# Patient Record
Sex: Male | Born: 1960 | Race: Black or African American | Hispanic: No | Marital: Married | Smoking: Never smoker
Health system: Southern US, Community
[De-identification: ages and names within clinical notes are randomized; demographics above are authoritative.]

## PROBLEM LIST (undated history)

## (undated) DIAGNOSIS — I10 Essential (primary) hypertension: Secondary | ICD-10-CM

## (undated) DIAGNOSIS — E119 Type 2 diabetes mellitus without complications: Secondary | ICD-10-CM

## (undated) HISTORY — PX: CERVICAL SPINE SURGERY: SHX589

## (undated) HISTORY — PX: CYST EXCISION: SHX5701

## (undated) HISTORY — PX: ACHILLES TENDON REPAIR: SUR1153

---

## 1997-08-08 ENCOUNTER — Encounter: Admission: RE | Admit: 1997-08-08 | Discharge: 1997-11-06 | Payer: Self-pay | Admitting: Orthopedic Surgery

## 1999-01-05 ENCOUNTER — Encounter: Payer: Self-pay | Admitting: Neurosurgery

## 1999-01-09 ENCOUNTER — Encounter: Payer: Self-pay | Admitting: Neurosurgery

## 1999-01-09 ENCOUNTER — Inpatient Hospital Stay (HOSPITAL_COMMUNITY): Admission: RE | Admit: 1999-01-09 | Discharge: 1999-01-10 | Payer: Self-pay | Admitting: Neurosurgery

## 1999-01-25 ENCOUNTER — Encounter: Payer: Self-pay | Admitting: Neurosurgery

## 1999-01-25 ENCOUNTER — Encounter: Admission: RE | Admit: 1999-01-25 | Discharge: 1999-01-25 | Payer: Self-pay | Admitting: Neurosurgery

## 1999-03-05 ENCOUNTER — Encounter: Admission: RE | Admit: 1999-03-05 | Discharge: 1999-03-05 | Payer: Self-pay | Admitting: Neurosurgery

## 1999-03-05 ENCOUNTER — Encounter: Payer: Self-pay | Admitting: Neurosurgery

## 2013-03-04 ENCOUNTER — Other Ambulatory Visit: Payer: Self-pay | Admitting: Family Medicine

## 2013-03-04 ENCOUNTER — Ambulatory Visit
Admission: RE | Admit: 2013-03-04 | Discharge: 2013-03-04 | Disposition: A | Discharge status: Home / Self Care | Payer: 59 | Source: Ambulatory Visit | Attending: Family Medicine | Admitting: Family Medicine

## 2013-03-04 DIAGNOSIS — R0789 Other chest pain: Secondary | ICD-10-CM

## 2013-03-04 DIAGNOSIS — R05 Cough: Secondary | ICD-10-CM

## 2013-03-04 DIAGNOSIS — R059 Cough, unspecified: Secondary | ICD-10-CM

## 2015-07-01 ENCOUNTER — Emergency Department (HOSPITAL_BASED_OUTPATIENT_CLINIC_OR_DEPARTMENT_OTHER)
Admission: EM | Admit: 2015-07-01 | Discharge: 2015-07-01 | Disposition: A | Discharge status: Home / Self Care | Payer: 59 | Attending: Emergency Medicine | Admitting: Emergency Medicine

## 2015-07-01 ENCOUNTER — Encounter (HOSPITAL_BASED_OUTPATIENT_CLINIC_OR_DEPARTMENT_OTHER): Payer: Self-pay | Admitting: Emergency Medicine

## 2015-07-01 ENCOUNTER — Emergency Department (HOSPITAL_BASED_OUTPATIENT_CLINIC_OR_DEPARTMENT_OTHER): Payer: 59

## 2015-07-01 DIAGNOSIS — Y9302 Activity, running: Secondary | ICD-10-CM | POA: Diagnosis not present

## 2015-07-01 DIAGNOSIS — S86002A Unspecified injury of left Achilles tendon, initial encounter: Secondary | ICD-10-CM | POA: Diagnosis not present

## 2015-07-01 DIAGNOSIS — Y999 Unspecified external cause status: Secondary | ICD-10-CM | POA: Insufficient documentation

## 2015-07-01 DIAGNOSIS — X58XXXA Exposure to other specified factors, initial encounter: Secondary | ICD-10-CM | POA: Insufficient documentation

## 2015-07-01 DIAGNOSIS — I10 Essential (primary) hypertension: Secondary | ICD-10-CM | POA: Insufficient documentation

## 2015-07-01 DIAGNOSIS — S99912A Unspecified injury of left ankle, initial encounter: Secondary | ICD-10-CM | POA: Diagnosis present

## 2015-07-01 DIAGNOSIS — Y929 Unspecified place or not applicable: Secondary | ICD-10-CM | POA: Diagnosis not present

## 2015-07-01 HISTORY — DX: Essential (primary) hypertension: I10

## 2015-07-01 NOTE — ED Provider Notes (Signed)
CSN: [id number]     Arrival date & time 07/01/15  0909 History   First MD Initiated Contact with Patient 07/01/15 0915     Chief Complaint  Patient presents with  . Ankle Pain    (Consider location/radiation/quality/duration/timing/severity/associated sxs/prior Treatment) Patient is a 55 y.o. male presenting with ankle pain. The history is provided by the patient and medical records. No language interpreter was used.  Ankle Pain  Christy Sartoriusnthony V Bodin is a 55 y.o. male  with a PMH of HTN who presents to the Emergency Department complaining of non-radiating left posterior ankle pain which began while running at a softball game last night. Patient states he was turning around first base when he felt and heard a pop. He was able to bear weight following the incident, but pain and swelling were appreciated. Ibuprofen with some relief. No pain at rest, but pain with plantarflexion and ambulation. No recent fluoroquinolone or steroid use.  Past Medical History  Diagnosis Date  . Hypertension    History reviewed. No pertinent past surgical history. History reviewed. No pertinent family history. Social History  Substance Use Topics  . Smoking status: Never Smoker   . Smokeless tobacco: None  . Alcohol Use: Yes     Comment: very rarley     Review of Systems  Musculoskeletal: Positive for myalgias and arthralgias.  Skin: Negative for color change.      Allergies  Review of patient's allergies indicates no known allergies.  Home Medications   Prior to Admission medications   Not on File   BP 128/81 mmHg  Pulse 72  Temp(Src) 98.6 F (37 C) (Oral)  Resp 18  Ht 5\' 7"  (1.702 m)  Wt 106.142 kg  BMI 36.64 kg/m2  SpO2 97% Physical Exam  Constitutional: He is oriented to person, place, and time. He appears well-developed and well-nourished.  Alert and in no acute distress  HENT:  Head: Normocephalic and atraumatic.  Cardiovascular: Normal rate, regular rhythm and normal heart  sounds.   Pulmonary/Chest: Effort normal and breath sounds normal. No respiratory distress.  Musculoskeletal:       Legs: No tenderness to palpation of the forefoot, lateral/medial malleolus. TTP as depicted in image, associated swelling. No warmth or ecchymosis present. Patient able to ambulate but unable to stand on toes on affected side. Decreased ROM with plantarflexion.   Neurological: He is alert and oriented to person, place, and time.  Skin: Skin is warm and dry.  Nursing note and vitals reviewed.   ED Course  Procedures (including critical care time) Labs Review Labs Reviewed - No data to display  Imaging Review Dg Ankle Complete Left  07/01/2015  CLINICAL DATA:  Left posterior ankle pain after softball related injury yesterday, pt states he felt his achilles tendon popDifficulty bearing weight EXAM: LEFT ANKLE COMPLETE - 3+ VIEW COMPARISON:  None. FINDINGS: No acute fracture. Ankle mortise is normally spaced and aligned. There is a bony prominence along the dorsal aspect of the talus just anterior to the ankle joint. There is some edema in the pre Achilles fat. Overall, the Achilles shadow suggests that the tendon is at least partly intact. IMPRESSION: 1. No fracture or dislocation. 2. Pre Achilles soft tissue edema. No evidence of complete disruption of the Achilles tendon. Consider follow-up ankle MRI if a more significant Achilles injury is suspected clinically. Electronically Signed   By: Amie Portlandavid  Ormond M.D.   On: 07/01/2015 09:51   I have personally reviewed and evaluated these images and lab  results as part of my medical decision-making.   EKG Interpretation None      MDM   Final diagnoses:  Achilles tendon injury, left, initial encounter   JAQUIS PICKLESIMER presents to ED for left posterior ankle pain. On exam, pain is in the area of the Achilles and findings consistent with Achilles tendon injury. X-rays were obtained which show pre-Achilles soft tissue edema with  no evidence of complete disruption of achilles tendon. Patient placed in posterior splint and instructed to call ortho first thing Monday morning for follow up appointment. Patient re-evaluated following splint placement and feels comfortable. Good cap refill. Home care instructions discussed and all questions answered.   [facility] Ward, PA-C 07/01/15 1039  Pricilla Loveless, MD 07/02/15 [phone]

## 2015-07-01 NOTE — ED Notes (Signed)
PA at bedside.

## 2015-07-01 NOTE — ED Notes (Signed)
Patient transported to X-ray 

## 2015-07-01 NOTE — ED Notes (Signed)
Splint being placed by EMT

## 2015-07-01 NOTE — ED Notes (Signed)
C/o left ankle pain while running in a softball game last night. Able to bear weight but pain with flexion of toes. Mild swelling noted.

## 2015-07-01 NOTE — Discharge Instructions (Signed)
Please call the orthopedic physician listed first thing Monday morning to schedule an appointment. Use crutches until you see the orthopedic physician. Return to ER for any new or worsening symptoms, any additional concerns.  Partial (Incomplete) Achilles Tendon Rupture An Achilles tendon rupture is an injury in which the tough, cord-like band that attaches the lower muscles of your leg to your heel (Achilles tendon) tears (ruptures). In a partial Achilles tendon rupture, surgery may not be needed. CAUSES  A tendon may rupture if it is weakened or weakening (degenerative) and a sudden stress is applied to it. Weakening or degeneration of a tendon may be caused by:  Recurrent injuries, such as those causing Achilles tendonitis.  Damaged tendons.  Aging.  Vascular disease of the tendon. SIGNS AND SYMPTOMS  Feeling as if you were struck violently in the back of the ankle.  Hearing a "pop" and experiencing severe, sudden (acute) pain; however, the absence of pain does not mean there was not a rupture. DIAGNOSIS A physical exam is usually all that is needed to diagnose an Achilles tendon rupture. During the exam, your health care provider will touch the tendon and the structures around it. You may be asked to lie on your stomach or kneel on a chair while your health care provider squeezes your calf muscle. You most likely have a ruptured tendon if your foot does not flex.  Sometimes tests are performed. These may include:  Ultrasonography. This allows quick confirmation of the diagnosis.  An X-ray exam.  An MRI. TREATMENT  Treatment consists of:  Ice applied to the area.  Pain relieving medicines.  Rest.  Crutches.  Keeping the ankle from moving (immobilization), usually with asplint, for 6-10 weeks. If the injury does not improve within 3-6 months, you may need to go to a specialized runners' clinic or see a sports medicine specialist, physical therapist, or orthopedic  surgeon. HOME CARE INSTRUCTIONS   Apply ice to the injured area:   Put ice in a plastic bag.  Place a towel between your skin and the bag.  Leave the ice on for 20 minutes, 2-3 times a day.  Use crutches and move about only as instructed by your health care provider.  Keep the leg elevated above the level of the heart (the center of the chest) at all times when not using the bathroom. Do not dangle the leg over a chair, couch, or bed. When lying down, elevate your leg on a few pillows. Elevation prevents swelling and reduces pain.  Avoid use other than gentle range of motion of the toes while the tendon is painful.  Do not drive a car until your health care provider specifically tells you it is safe to do so.  Take all medicines as directed by your health care provider.  Keep all follow-up visits with your health care provider. SEEK MEDICAL CARE IF:   Your pain and swelling increase or pain is uncontrolled with medicines.   You develop new, unexplained symptoms or your symptoms get worse.   You cannot move your toes or foot.  You develop warmth and swelling in your foot.  You have an unexplained fever.  MAKE SURE YOU:   Understand these instructions.  Will watch your condition.  Will get help right away if you are not doing well or get worse.   This information is not intended to replace advice given to you by your health care provider. Make sure you discuss any questions you have with your health  care provider.   Document Released: 11/07/2004 Document Revised: 06/14/2014 Document Reviewed: 09/18/2012 Elsevier Interactive Patient Education [employer]

## 2015-07-17 DIAGNOSIS — M66362 Spontaneous rupture of flexor tendons, left lower leg: Secondary | ICD-10-CM | POA: Diagnosis not present

## 2015-08-16 DIAGNOSIS — M66362 Spontaneous rupture of flexor tendons, left lower leg: Secondary | ICD-10-CM | POA: Diagnosis not present

## 2015-08-21 DIAGNOSIS — M25672 Stiffness of left ankle, not elsewhere classified: Secondary | ICD-10-CM | POA: Diagnosis not present

## 2015-08-21 DIAGNOSIS — M6281 Muscle weakness (generalized): Secondary | ICD-10-CM | POA: Diagnosis not present

## 2015-08-21 DIAGNOSIS — M25572 Pain in left ankle and joints of left foot: Secondary | ICD-10-CM | POA: Diagnosis not present

## 2015-08-21 DIAGNOSIS — M66362 Spontaneous rupture of flexor tendons, left lower leg: Secondary | ICD-10-CM | POA: Diagnosis not present

## 2015-08-28 DIAGNOSIS — M25672 Stiffness of left ankle, not elsewhere classified: Secondary | ICD-10-CM | POA: Diagnosis not present

## 2015-08-28 DIAGNOSIS — M25572 Pain in left ankle and joints of left foot: Secondary | ICD-10-CM | POA: Diagnosis not present

## 2015-08-28 DIAGNOSIS — M66362 Spontaneous rupture of flexor tendons, left lower leg: Secondary | ICD-10-CM | POA: Diagnosis not present

## 2015-08-28 DIAGNOSIS — M6281 Muscle weakness (generalized): Secondary | ICD-10-CM | POA: Diagnosis not present

## 2015-09-11 DIAGNOSIS — M25672 Stiffness of left ankle, not elsewhere classified: Secondary | ICD-10-CM | POA: Diagnosis not present

## 2015-09-11 DIAGNOSIS — M25572 Pain in left ankle and joints of left foot: Secondary | ICD-10-CM | POA: Diagnosis not present

## 2015-09-11 DIAGNOSIS — M6281 Muscle weakness (generalized): Secondary | ICD-10-CM | POA: Diagnosis not present

## 2015-09-11 DIAGNOSIS — M66362 Spontaneous rupture of flexor tendons, left lower leg: Secondary | ICD-10-CM | POA: Diagnosis not present

## 2015-09-18 DIAGNOSIS — M25572 Pain in left ankle and joints of left foot: Secondary | ICD-10-CM | POA: Diagnosis not present

## 2015-09-18 DIAGNOSIS — M66362 Spontaneous rupture of flexor tendons, left lower leg: Secondary | ICD-10-CM | POA: Diagnosis not present

## 2015-09-18 DIAGNOSIS — M25672 Stiffness of left ankle, not elsewhere classified: Secondary | ICD-10-CM | POA: Diagnosis not present

## 2015-09-18 DIAGNOSIS — M6281 Muscle weakness (generalized): Secondary | ICD-10-CM | POA: Diagnosis not present

## 2015-11-29 DIAGNOSIS — I10 Essential (primary) hypertension: Secondary | ICD-10-CM | POA: Diagnosis not present

## 2015-11-29 DIAGNOSIS — J219 Acute bronchiolitis, unspecified: Secondary | ICD-10-CM | POA: Diagnosis not present

## 2015-11-29 DIAGNOSIS — E785 Hyperlipidemia, unspecified: Secondary | ICD-10-CM | POA: Diagnosis not present

## 2015-11-29 DIAGNOSIS — J069 Acute upper respiratory infection, unspecified: Secondary | ICD-10-CM | POA: Diagnosis not present

## 2016-01-30 DIAGNOSIS — J069 Acute upper respiratory infection, unspecified: Secondary | ICD-10-CM | POA: Diagnosis not present

## 2017-01-08 ENCOUNTER — Emergency Department (HOSPITAL_BASED_OUTPATIENT_CLINIC_OR_DEPARTMENT_OTHER): Payer: BLUE CROSS/BLUE SHIELD

## 2017-01-08 ENCOUNTER — Emergency Department (HOSPITAL_BASED_OUTPATIENT_CLINIC_OR_DEPARTMENT_OTHER)
Admission: EM | Admit: 2017-01-08 | Discharge: 2017-01-08 | Disposition: A | Discharge status: Home / Self Care | Payer: BLUE CROSS/BLUE SHIELD | Attending: Emergency Medicine | Admitting: Emergency Medicine

## 2017-01-08 ENCOUNTER — Encounter (HOSPITAL_BASED_OUTPATIENT_CLINIC_OR_DEPARTMENT_OTHER): Payer: Self-pay

## 2017-01-08 DIAGNOSIS — R634 Abnormal weight loss: Secondary | ICD-10-CM | POA: Diagnosis not present

## 2017-01-08 DIAGNOSIS — R42 Dizziness and giddiness: Secondary | ICD-10-CM | POA: Insufficient documentation

## 2017-01-08 DIAGNOSIS — H538 Other visual disturbances: Secondary | ICD-10-CM | POA: Diagnosis not present

## 2017-01-08 DIAGNOSIS — R631 Polydipsia: Secondary | ICD-10-CM | POA: Insufficient documentation

## 2017-01-08 DIAGNOSIS — R5383 Other fatigue: Secondary | ICD-10-CM | POA: Insufficient documentation

## 2017-01-08 DIAGNOSIS — R358 Other polyuria: Secondary | ICD-10-CM | POA: Insufficient documentation

## 2017-01-08 DIAGNOSIS — R1013 Epigastric pain: Secondary | ICD-10-CM | POA: Diagnosis not present

## 2017-01-08 DIAGNOSIS — R739 Hyperglycemia, unspecified: Secondary | ICD-10-CM | POA: Diagnosis not present

## 2017-01-08 DIAGNOSIS — I10 Essential (primary) hypertension: Secondary | ICD-10-CM | POA: Diagnosis not present

## 2017-01-08 DIAGNOSIS — E0810 Diabetes mellitus due to underlying condition with ketoacidosis without coma: Secondary | ICD-10-CM | POA: Diagnosis not present

## 2017-01-08 DIAGNOSIS — R109 Unspecified abdominal pain: Secondary | ICD-10-CM | POA: Diagnosis not present

## 2017-01-08 DIAGNOSIS — R5381 Other malaise: Secondary | ICD-10-CM | POA: Insufficient documentation

## 2017-01-08 DIAGNOSIS — N179 Acute kidney failure, unspecified: Secondary | ICD-10-CM | POA: Diagnosis not present

## 2017-01-08 LAB — CBC WITH DIFFERENTIAL/PLATELET
BASOS ABS: 0 10*3/uL (ref 0.0–0.1)
Basophils Relative: 0 %
EOS ABS: 0 10*3/uL (ref 0.0–0.7)
EOS PCT: 0 %
HCT: 44.6 % (ref 39.0–52.0)
HEMOGLOBIN: 15.3 g/dL (ref 13.0–17.0)
Lymphocytes Relative: 21 %
Lymphs Abs: 1.6 10*3/uL (ref 0.7–4.0)
MCH: 27.9 pg (ref 26.0–34.0)
MCHC: 34.3 g/dL (ref 30.0–36.0)
MCV: 81.4 fL (ref 78.0–100.0)
Monocytes Absolute: 0.5 10*3/uL (ref 0.1–1.0)
Monocytes Relative: 6 %
Neutro Abs: 5.7 10*3/uL (ref 1.7–7.7)
Neutrophils Relative %: 73 %
PLATELETS: 170 10*3/uL (ref 150–400)
RBC: 5.48 MIL/uL (ref 4.22–5.81)
RDW: 13.8 % (ref 11.5–15.5)
WBC: 7.8 10*3/uL (ref 4.0–10.5)

## 2017-01-08 LAB — URINALYSIS, ROUTINE W REFLEX MICROSCOPIC
Bilirubin Urine: NEGATIVE
Glucose, UA: >=500 mg/dL — AB
HGB URINE DIPSTICK: NEGATIVE
Ketones, ur: 40 mg/dL — AB
LEUKOCYTES UA: NEGATIVE
NITRITE: NEGATIVE
PROTEIN: NEGATIVE mg/dL
Specific Gravity, Urine: 1.01 (ref 1.005–1.030)
pH: 5.5 (ref 5.0–8.0)

## 2017-01-08 LAB — BASIC METABOLIC PANEL
Anion gap: 15 (ref 5–15)
Anion gap: 12 (ref 5–15)
BUN: 26 mg/dL — AB (ref 6–20)
BUN: 23 mg/dL — AB (ref 6–20)
CALCIUM: 7.6 mg/dL — AB (ref 8.9–10.3)
CO2: 17 mmol/L — ABNORMAL LOW (ref 22–32)
CO2: 15 mmol/L — ABNORMAL LOW (ref 22–32)
Calcium: 8.7 mg/dL — ABNORMAL LOW (ref 8.9–10.3)
Chloride: 96 mmol/L — ABNORMAL LOW (ref 101–111)
Chloride: 107 mmol/L (ref 101–111)
Creatinine, Ser: 1.54 mg/dL — ABNORMAL HIGH (ref 0.61–1.24)
Creatinine, Ser: 1.24 mg/dL (ref 0.61–1.24)
GFR calc Af Amer: >60 mL/min (ref >60)
GFR, EST AFRICAN AMERICAN: 57 mL/min — AB (ref >60)
GFR, EST NON AFRICAN AMERICAN: 49 mL/min — AB (ref >60)
GLUCOSE: 340 mg/dL — AB (ref 65–99)
Glucose, Bld: 705 mg/dL (ref 65–99)
POTASSIUM: 5.1 mmol/L (ref 3.5–5.1)
POTASSIUM: 4.2 mmol/L (ref 3.5–5.1)
SODIUM: 128 mmol/L — AB (ref 135–145)
SODIUM: 134 mmol/L — AB (ref 135–145)

## 2017-01-08 LAB — URINALYSIS, MICROSCOPIC (REFLEX)
RBC / HPF: NONE SEEN RBC/hpf (ref 0–5)
WBC UA: NONE SEEN WBC/hpf (ref 0–5)

## 2017-01-08 LAB — CBG MONITORING, ED
GLUCOSE-CAPILLARY: 587 mg/dL — AB (ref 65–99)
GLUCOSE-CAPILLARY: 525 mg/dL — AB (ref 65–99)
GLUCOSE-CAPILLARY: 398 mg/dL — AB (ref 65–99)

## 2017-01-08 LAB — HEPATIC FUNCTION PANEL
ALBUMIN: 4 g/dL (ref 3.5–5.0)
ALT: 24 U/L (ref 17–63)
AST: 16 U/L (ref 15–41)
Alkaline Phosphatase: 142 U/L — ABNORMAL HIGH (ref 38–126)
BILIRUBIN DIRECT: 0.2 mg/dL (ref 0.1–0.5)
Indirect Bilirubin: 1.4 mg/dL — ABNORMAL HIGH (ref 0.3–0.9)
TOTAL PROTEIN: 7.1 g/dL (ref 6.5–8.1)
Total Bilirubin: 1.6 mg/dL — ABNORMAL HIGH (ref 0.3–1.2)

## 2017-01-08 LAB — LIPASE, BLOOD: LIPASE: 58 U/L — AB (ref 11–51)

## 2017-01-08 MED ORDER — METFORMIN HCL 500 MG PO TABS: 500.0000 mg | ORAL_TABLET | Freq: Two times a day (BID) | ORAL | 0 refills | Status: AC

## 2017-01-08 MED ORDER — SODIUM CHLORIDE 0.9 % IV BOLUS (SEPSIS)
1000.0000 mL | Freq: Once | INTRAVENOUS | Status: AC
  Administered 2017-01-08: 1000 mL via INTRAVENOUS

## 2017-01-08 MED ORDER — METFORMIN HCL 500 MG PO TABS
500.0000 mg | ORAL_TABLET | Freq: Once | ORAL | Status: AC
  Administered 2017-01-08: 500 mg via ORAL
  Filled 2017-01-08: qty 1

## 2017-01-08 MED ORDER — IOPAMIDOL (ISOVUE-300) INJECTION 61%
100.0000 mL | Freq: Once | INTRAVENOUS | Status: AC | PRN
  Administered 2017-01-08: 100 mL via INTRAVENOUS

## 2017-01-08 MED ORDER — INSULIN ASPART 100 UNIT/ML IV SOLN
10.0000 [IU] | Freq: Once | INTRAVENOUS | Status: AC
  Administered 2017-01-08: 10 [IU] via INTRAVENOUS
  Filled 2017-01-08: qty 1

## 2017-01-08 NOTE — ED Notes (Signed)
Diabetic  Teaching booklet given and diabetic consult ordered

## 2017-01-08 NOTE — Discharge Instructions (Signed)
Please take Metformin twice daily with food Drink plenty of fluids Follow up with your doctor Return if worsening

## 2017-01-08 NOTE — ED Triage Notes (Signed)
Pt states he was sent by PCP for BS 451-NAD-steady gait

## 2017-01-08 NOTE — ED Provider Notes (Signed)
[facility] EMERGENCY DEPARTMENT Provider Note   CSN: [id number] Arrival date & time: 01/08/17  1558     History   Chief Complaint Chief Complaint  Patient presents with  . Hyperglycemia    HPI Eric Byrd is a 56 y.o. male who presents with hyperglycemia. PMH significant for HTN. He is not currently on any medicines. The patient states that over the past 2 weeks he has had fatigue, malaise, bilateral blurry vision, lightheadedness with standing and feeling off balance, polyuria, and polydipsia. He went to his doctor today who did a UA and CBG which showed 100 ketones in the urine and glucose of 451. He was sent to the ED for further evaluation. He also reports some epigastric abdominal pain which he attributes to reflux. He has been taking OTC reflux meds for this with good relief. Pain is currently 5/10. He denies recent fever, chills, syncope, URI symptoms, chest pain, SOB, lower abdominal pain, N/V/D, dysuria. He has no prior diagnosis of Diabetes. He does not have a significant family history of diabetes. Also of note he has had unintentinal weight loss of 32.8 pounds over the last year.  HPI  Past Medical History:  Diagnosis Date  . Hypertension     There are no active problems to display for this patient.   Past Surgical History:  Procedure Laterality Date  . ACHILLES TENDON REPAIR    . CERVICAL SPINE SURGERY    . CYST EXCISION         Home Medications    Prior to Admission medications   Not on File    Family History No family history on file.  Social History Social History   Tobacco Use  . Smoking status: Never Smoker  . Smokeless tobacco: Never Used  Substance Use Topics  . Alcohol use: Yes    Comment: very rarley   . Drug use: No     Allergies   Patient has no known allergies.   Review of Systems Review of Systems  Constitutional: Positive for fatigue and unexpected weight change. Negative for chills and fever.  Eyes:  Positive for visual disturbance.  Respiratory: Negative for shortness of breath.   Cardiovascular: Negative for chest pain.  Gastrointestinal: Positive for abdominal pain. Negative for diarrhea, nausea and vomiting.  Endocrine: Positive for polydipsia and polyuria.  Genitourinary: Positive for frequency.  Neurological: Positive for light-headedness. Negative for syncope.  All other systems reviewed and are negative.    Physical Exam Updated Vital Signs BP (!) 141/86 (BP Location: Left Arm)   Pulse (!) 106   Temp 98.3 F (36.8 C) (Oral)   Resp 18   Ht 5\' 7"  (1.702 m)   Wt 100.5 kg (221 lb 9 oz)   SpO2 98%   BMI 34.70 kg/m   Physical Exam  Constitutional: He is oriented to person, place, and time. He appears well-developed and well-nourished. No distress.  HENT:  Head: Normocephalic and atraumatic.  Eyes: Conjunctivae are normal. Pupils are equal, round, and reactive to light. Right eye exhibits no discharge. Left eye exhibits no discharge. No scleral icterus.  Neck: Normal range of motion.  Cardiovascular: Normal rate and regular rhythm. Exam reveals no gallop and no friction rub.  No murmur heard. Pulmonary/Chest: Effort normal and breath sounds normal. No stridor. No respiratory distress. He has no wheezes. He has no rales. He exhibits no tenderness.  Abdominal: Soft. Bowel sounds are normal. He exhibits no distension and no mass. There is tenderness (mild epigastric  tenderness). There is no rebound and no guarding. No hernia.  Neurological: He is alert and oriented to person, place, and time.  Skin: Skin is warm and dry.  Psychiatric: He has a normal mood and affect. His behavior is normal.  Nursing note and vitals reviewed.    ED Treatments / Results  Labs (all labs ordered are listed, but only abnormal results are displayed) Labs Reviewed  BASIC METABOLIC PANEL - Abnormal; Notable for the following components:      Result Value   Sodium 128 (*)    Chloride 96 (*)      CO2 17 (*)    Glucose, Bld 705 (*)    BUN 26 (*)    Creatinine, Ser 1.54 (*)    Calcium 8.7 (*)    GFR calc non Af Amer 49 (*)    GFR calc Af Amer 57 (*)    All other components within normal limits  URINALYSIS, ROUTINE W REFLEX MICROSCOPIC - Abnormal; Notable for the following components:   Glucose, UA >=500 (*)    Ketones, ur 40 (*)    All other components within normal limits  LIPASE, BLOOD - Abnormal; Notable for the following components:   Lipase 58 (*)    All other components within normal limits  HEPATIC FUNCTION PANEL - Abnormal; Notable for the following components:   Alkaline Phosphatase 142 (*)    Total Bilirubin 1.6 (*)    Indirect Bilirubin 1.4 (*)    All other components within normal limits  URINALYSIS, MICROSCOPIC (REFLEX) - Abnormal; Notable for the following components:   Bacteria, UA RARE (*)    Squamous Epithelial / LPF 0-5 (*)    All other components within normal limits  BASIC METABOLIC PANEL - Abnormal; Notable for the following components:   Sodium 134 (*)    CO2 15 (*)    Glucose, Bld 340 (*)    BUN 23 (*)    Calcium 7.6 (*)    All other components within normal limits  CBG MONITORING, ED - Abnormal; Notable for the following components:   Glucose-Capillary 587 (*)    All other components within normal limits  CBG MONITORING, ED - Abnormal; Notable for the following components:   Glucose-Capillary 525 (*)    All other components within normal limits  CBG MONITORING, ED - Abnormal; Notable for the following components:   Glucose-Capillary 398 (*)    All other components within normal limits  CBC WITH DIFFERENTIAL/PLATELET    EKG  EKG Interpretation None       Radiology Ct Abdomen Pelvis W Contrast  Result Date: 01/08/2017 CLINICAL DATA:  Abdominal pain. Unintentional weight loss. Elevated blood glucose. EXAM: CT ABDOMEN AND PELVIS WITH CONTRAST TECHNIQUE: Multidetector CT imaging of the abdomen and pelvis was performed using the  standard protocol following bolus administration of intravenous contrast. CONTRAST:  ISOVUE-300 IOPAMIDOL (ISOVUE-300) INJECTION 61% COMPARISON:  None. FINDINGS: Lower chest: No acute abnormality.  Right gynecomastia. Hepatobiliary: Hepatic steatosis. Normal appearance of the gallbladder. Pancreas: Unremarkable. No pancreatic ductal dilatation or surrounding inflammatory changes. Spleen: Normal in size without focal abnormality. Adrenals/Urinary Tract: Adrenal glands are unremarkable. Kidneys are normal, without renal calculi, focal lesion, or hydronephrosis. Bladder is unremarkable. Stomach/Bowel: Stomach is within normal limits. Appendix appears normal. No evidence of bowel wall thickening, distention, or inflammatory changes. Scattered colonic diverticulosis. Vascular/Lymphatic: No significant vascular findings are present. No enlarged abdominal or pelvic lymph nodes. Reproductive: Prostate is unremarkable. Other: No abdominal wall hernia or abnormality. No  abdominopelvic ascites. Musculoskeletal: No acute or significant osseous findings. IMPRESSION: Hepatic steatosis. Scattered colonic diverticulosis.  No evidence of diverticulitis. Otherwise, no CT evidence of acute abnormalities within the abdomen or pelvis. Electronically Signed   By: Ted Mcalpineobrinka  Dimitrova M.D.   On: 01/08/2017 19:22    Procedures Procedures (including critical care time)  Medications Ordered in ED Medications  sodium chloride 0.9 % bolus 1,000 mL (0 mLs Intravenous Stopped 01/08/17 1941)  insulin aspart (novoLOG) injection 10 Units (10 Units Intravenous Given 01/08/17 1723)  sodium chloride 0.9 % bolus 1,000 mL (0 mLs Intravenous Stopped 01/08/17 1941)  iopamidol (ISOVUE-300) 61 % injection 100 mL (100 mLs Intravenous Contrast Given 01/08/17 1902)  metFORMIN (GLUCOPHAGE) tablet 500 mg (500 mg Oral Given 01/08/17 2009)     Initial Impression / Assessment and Plan / ED Course  I have reviewed the triage vital signs and the  nursing notes.  Pertinent labs & imaging results that were available during my care of the patient were reviewed by me and considered in my medical decision making (see chart for details).  56 year old who presents with new onset diabetes. He is hypertensive and mildly tachycardic in triage. This has improved throughout his ED stay. Initial CBG is 587. Exam is overall unremarkable. He has mild epigastric tenderness but otherwise clinically appears well. CBC is normal. BMP shows hyponatremia (128), hypochloremia, elevated SCr (1.54), low CO2 (17). Anion gap is normal. LFTs shows elevated AP (142) and bilirubin (1.6). Lipase was mildly elevated at 58. UA has >500 glucose and 40 ketones. He was given 2L IVF and well as 10units of insulin. CT of abdomen was obtained which was overall unremarkable. Shared visit with Dr. Criss AlvineGoldston. After several hours of monitoring CBG has improved to 398. Repeat BMP is overall improved as well. He is essentially asymptomatic. He was given a dose of Metformin and a 30 day prescription to go home with. He was advised to follow up with his PCP and return if worsening.  Final Clinical Impressions(s) / ED Diagnoses   Final diagnoses:  Hyperglycemia  AKI (acute kidney injury) [facility](HCC)    ED Discharge Orders    None       Bethel BornGekas, Shakeera Rightmyer Marie, PA-C 01/09/17 1540    Pricilla LovelessGoldston, Scott, MD 01/09/17 1601

## 2017-01-09 MED FILL — metFORMIN HCL 500 MG TABS: 500 | 30 days supply | Qty: 60 | Fill #0

## 2017-01-10 DIAGNOSIS — I10 Essential (primary) hypertension: Secondary | ICD-10-CM | POA: Diagnosis not present

## 2017-01-10 DIAGNOSIS — M545 Low back pain: Secondary | ICD-10-CM | POA: Diagnosis not present

## 2017-01-10 DIAGNOSIS — E1165 Type 2 diabetes mellitus with hyperglycemia: Secondary | ICD-10-CM | POA: Diagnosis not present

## 2017-01-16 DIAGNOSIS — R05 Cough: Secondary | ICD-10-CM | POA: Diagnosis not present

## 2017-01-16 DIAGNOSIS — E118 Type 2 diabetes mellitus with unspecified complications: Secondary | ICD-10-CM | POA: Diagnosis not present

## 2017-01-26 DIAGNOSIS — H524 Presbyopia: Secondary | ICD-10-CM | POA: Diagnosis not present

## 2017-01-26 DIAGNOSIS — E119 Type 2 diabetes mellitus without complications: Secondary | ICD-10-CM | POA: Diagnosis not present

## 2017-02-05 DIAGNOSIS — E1165 Type 2 diabetes mellitus with hyperglycemia: Secondary | ICD-10-CM | POA: Diagnosis not present

## 2017-03-17 ENCOUNTER — Encounter: Payer: Self-pay | Admitting: Registered"

## 2017-03-17 ENCOUNTER — Encounter: Payer: BLUE CROSS/BLUE SHIELD | Attending: Family Medicine | Admitting: Registered"

## 2017-03-17 DIAGNOSIS — Z713 Dietary counseling and surveillance: Secondary | ICD-10-CM | POA: Diagnosis not present

## 2017-03-17 DIAGNOSIS — R634 Abnormal weight loss: Secondary | ICD-10-CM | POA: Diagnosis not present

## 2017-03-17 DIAGNOSIS — E119 Type 2 diabetes mellitus without complications: Secondary | ICD-10-CM | POA: Diagnosis not present

## 2017-03-17 NOTE — Patient Instructions (Signed)
Plan:  Aim for 4-5 Carb Choices per meal (60-75 grams)   Aim for 0-2 Carb Choices per snack if hungry (0-30 grams) Include protein with your meals and snacks Aim for 3 balanced meals per day, snacks if you are hungry Consider reading food labels for Total Carbohydrate and Saturated Fat Grams Continue with your activity level daily as tolerated Consider checking blood sugar at alternate times per day as directed by MD, consider checking your fasting blood sugar. Continue taking medication as directed by MD

## 2017-03-17 NOTE — Progress Notes (Signed)
Diabetes Self-Management Education  Visit Type: First/Initial  Appt. Start Time: 0800 Appt. End Time: 0910  03/17/2017  Mr. Eric Byrd, identified by name and date of birth, is a 57 y.o. male with a diagnosis of Diabetes: Type 2.   ASSESSMENT Patient states he has been having annual wellness check-ups and no indication of pre-diabetes. Pt states a few weeks before his  regular check up he started feeling symptoms consistent with hyperglycemia, at his visit had reading of 791 mg/dL and was sent to hospital. Patient reports he had lost about 30 lbs about 6-8 months prior to this. Patient states he had started changing his diet in effort to lose weight but had not made significant enough changes to explain this much weight loss.  Since diagnosis patient states he cut out sugar sweetened beverages, reduced starch intake and increased intake of water, vegetable and lean proteins.  Patient reports over last month PPBG has stayed around 110 with only one high reading of 154  Patient states he works 2 full-time jobs and sleeps 5-6 hrs per night which he states is enough rest for him and has plenty of energy through the day. Patient states stress level low (5-6 out of 10), but anticipates it will increase soon due to expected increased work load in the near future.   Diabetes Self-Management Education - 03/17/17 0806      Visit Information   Visit Type  First/Initial      Initial Visit   Diabetes Type  Type 2    Are you currently following a meal plan?  No    Are you taking your medications as prescribed?  Yes    Date Diagnosed  6 weeks ago      Health Coping   How would you rate your overall health?  Good      Psychosocial Assessment   Patient Belief/Attitude about Diabetes  Motivated to manage diabetes    How often do you need to have someone help you when you read instructions, pamphlets, or other written materials from your doctor or pharmacy?  1 - Never    What is the last grade  level you completed in school?  college      Complications   Last HgB A1C per patient/outside source  14 % greater than per chart 12/2016    How often do you check your blood sugar?  1-2 times/day    Fasting Blood glucose range (mg/dL)  --    Postprandial Blood glucose range (mg/dL)  16-109 checking 2 hrs PPBG, br & lunch, stays around 110 mg/dL    Number of hypoglycemic episodes per month  0    Number of hyperglycemic episodes per week  0    Have you had a dilated eye exam in the past 12 months?  Yes    Have you had a dental exam in the past 12 months?  No    Are you checking your feet?  Yes    How many days per week are you checking your feet?  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  4    How many minutes per day do you exercise?  25    Total minutes per week of exercise  100      Patient Education   Previous Diabetes Education  No    Disease state   Definition of diabetes, type 1 and 2, and the diagnosis of diabetes;Factors that contribute to the development of diabetes    Nutrition management   Role of diet in the treatment of diabetes and the relationship between the three main macronutrients and blood glucose level;Carbohydrate counting;Food label reading, portion sizes and measuring food.    Physical activity and exercise   Role of exercise on diabetes management, blood pressure control and cardiac health.    Medications  Reviewed patients medication for diabetes, action, purpose, timing of dose and side effects.     Monitoring  Identified appropriate SMBG and/or A1C goals.    Acute complications  Taught treatment of hypoglycemia - the 15 rule.    Chronic complications  Relationship between chronic complications and blood glucose control    Psychosocial adjustment  Role of stress on diabetes      Individualized Goals (developed by patient)   Nutrition  General guidelines for healthy choices and portions discussed    Monitoring   test my blood glucose as discussed      Outcomes   Expected Outcomes  Demonstrated interest in learning. Expect positive outcomes    Future DMSE  PRN    Program Status  Completed     Individualized Plan for Diabetes Self-Management Training:   Learning Objective:  Patient will have a greater understanding of diabetes self-management. Patient education plan is to attend individual and/or group sessions per assessed needs and concerns.   Patient Instructions  Plan:  Aim for 4-5 Carb Choices per meal (60-75 grams)   Aim for 0-2 Carb Choices per snack if hungry (0-30 grams) Include protein with your meals and snacks Aim for 3 balanced meals per day, snacks if you are hungry Consider reading food labels for Total Carbohydrate and Saturated Fat Grams Continue with your activity level daily as tolerated Consider checking blood sugar at alternate times per day as directed by MD, consider checking your fasting blood sugar. Continue taking medication as directed by MD  Expected Outcomes:  Demonstrated interest in learning. Expect positive outcomes  Education material provided: Living Well with Diabetes, A1C conversion sheet, My Plate and Carbohydrate counting sheet, hyper/hypoglycemia symptom overlap   If problems or questions, patient to contact team via:  Phone  Future DSME appointment: PRN

## 2017-03-18 DIAGNOSIS — E119 Type 2 diabetes mellitus without complications: Secondary | ICD-10-CM | POA: Insufficient documentation

## 2017-03-24 DIAGNOSIS — I10 Essential (primary) hypertension: Secondary | ICD-10-CM | POA: Diagnosis not present

## 2017-03-24 DIAGNOSIS — E1165 Type 2 diabetes mellitus with hyperglycemia: Secondary | ICD-10-CM | POA: Diagnosis not present

## 2017-06-23 DIAGNOSIS — E118 Type 2 diabetes mellitus with unspecified complications: Secondary | ICD-10-CM | POA: Diagnosis not present

## 2017-06-23 DIAGNOSIS — I10 Essential (primary) hypertension: Secondary | ICD-10-CM | POA: Diagnosis not present

## 2018-01-13 DIAGNOSIS — I10 Essential (primary) hypertension: Secondary | ICD-10-CM | POA: Diagnosis not present

## 2018-01-13 DIAGNOSIS — Z6838 Body mass index (BMI) 38.0-38.9, adult: Secondary | ICD-10-CM | POA: Diagnosis not present

## 2018-01-13 DIAGNOSIS — E118 Type 2 diabetes mellitus with unspecified complications: Secondary | ICD-10-CM | POA: Diagnosis not present

## 2018-04-08 DIAGNOSIS — J209 Acute bronchitis, unspecified: Secondary | ICD-10-CM | POA: Diagnosis not present

## 2018-04-08 DIAGNOSIS — R05 Cough: Secondary | ICD-10-CM | POA: Diagnosis not present

## 2018-04-08 DIAGNOSIS — R0981 Nasal congestion: Secondary | ICD-10-CM | POA: Diagnosis not present

## 2018-04-08 DIAGNOSIS — R509 Fever, unspecified: Secondary | ICD-10-CM | POA: Diagnosis not present

## 2018-11-14 IMAGING — CT CT ABD-PELV W/ CM
2 of 5 series · 16 of 46 positions shown, 18 images · IV contrast (APPLIED)
Comparison: None.

CLINICAL DATA: Abdominal pain. Unintentional weight loss. Elevated
blood glucose.

EXAM:
CT ABDOMEN AND PELVIS WITH CONTRAST
TECHNIQUE: Multidetector CT imaging of the abdomen and pelvis was performed
using the standard protocol following bolus administration of
intravenous contrast.
CONTRAST:  100mL DQGYTC-1YY IOPAMIDOL (DQGYTC-1YY) INJECTION 61%

[Series 2: axial st · axial · 0.96mm/px · z∈[-548,-83]mm · 13 of 105 slices shown, 15 images]
[im 6/105  soft-tissue]
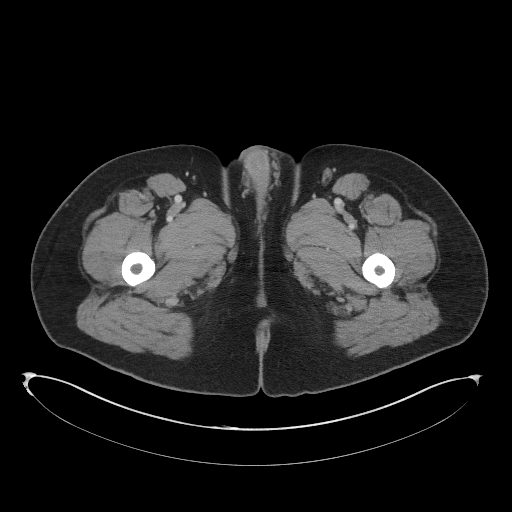
[im 6/105  bone]
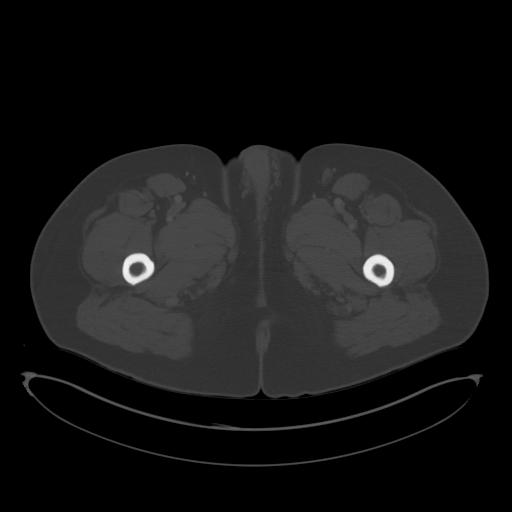
[im 17/105  soft-tissue]
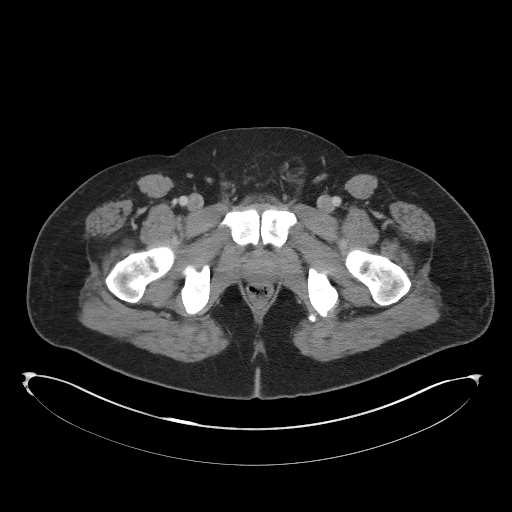
[im 22/105  soft-tissue]
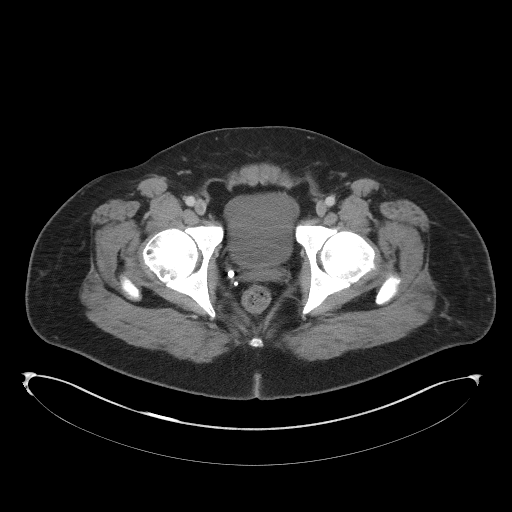
[im 28/105  soft-tissue]
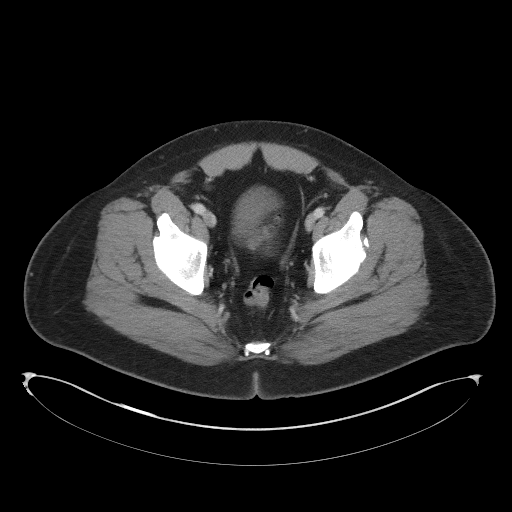
[im 39/105  soft-tissue]
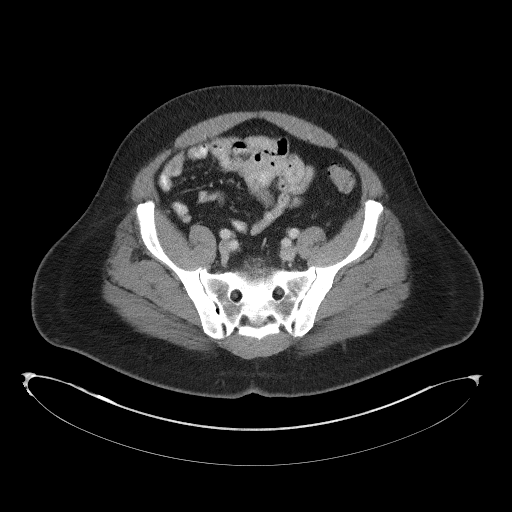
[im 44/105  soft-tissue]
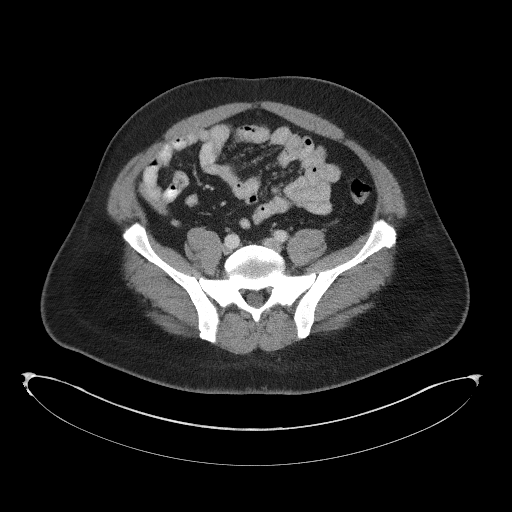
[im 55/105  soft-tissue]
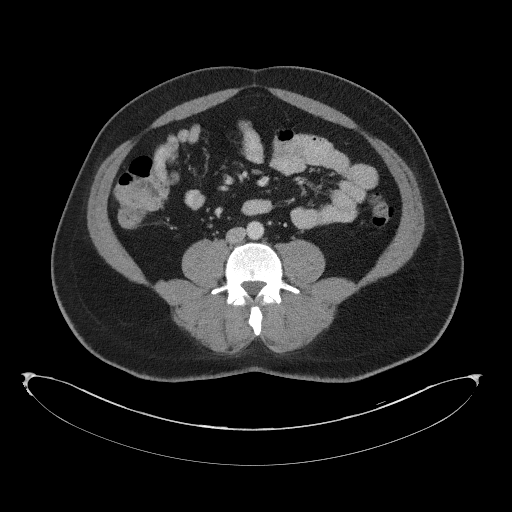
[im 61/105  soft-tissue]
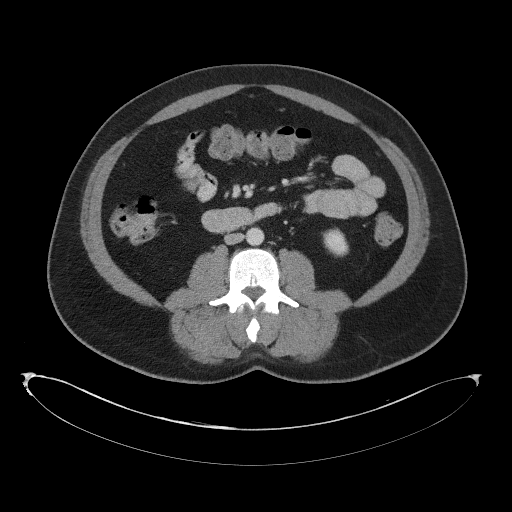
[im 66/105  soft-tissue]
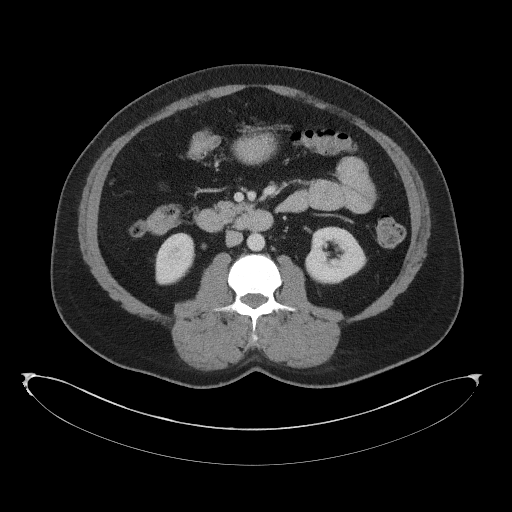
[im 66/105  bone]
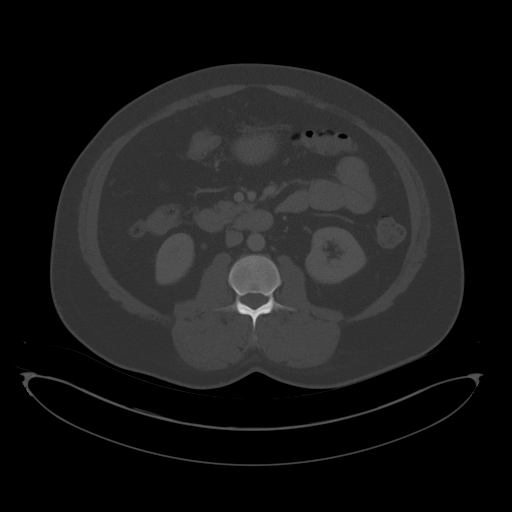
[im 77/105  soft-tissue]
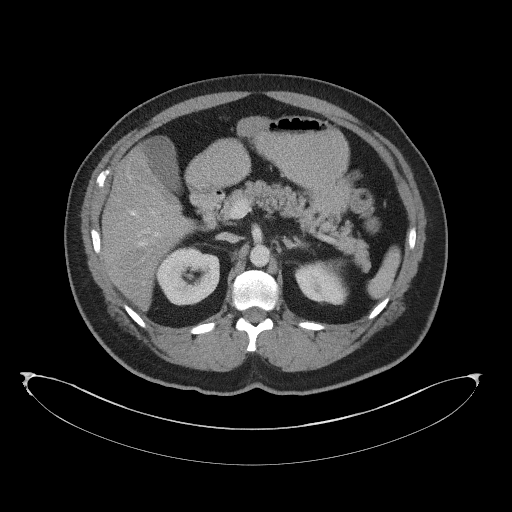
[im 83/105  soft-tissue]
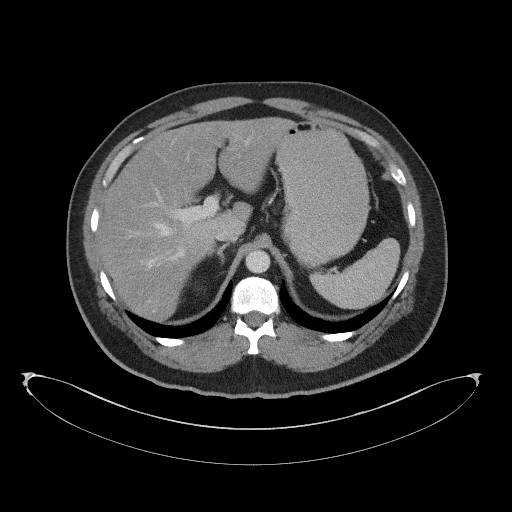
[im 88/105  soft-tissue]
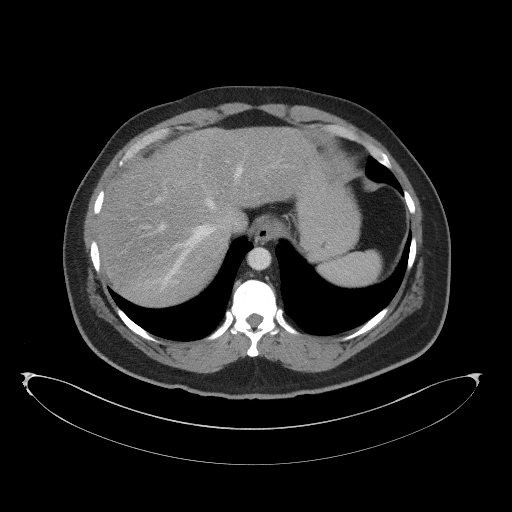
[im 99/105  soft-tissue]
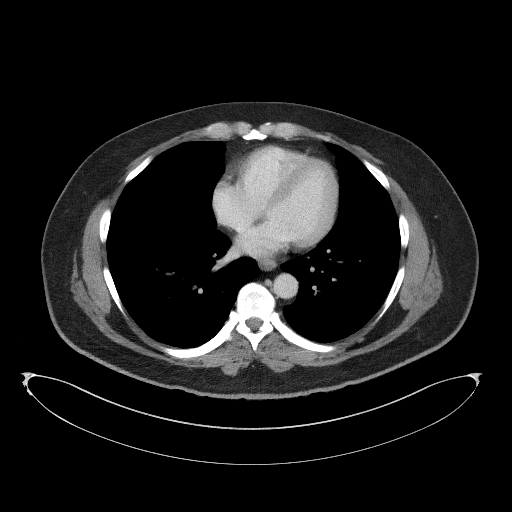

[Series 5: coronal st · coronal · 0.92mm/px · 3 of 99 slices shown]
[im 33/99  soft-tissue]
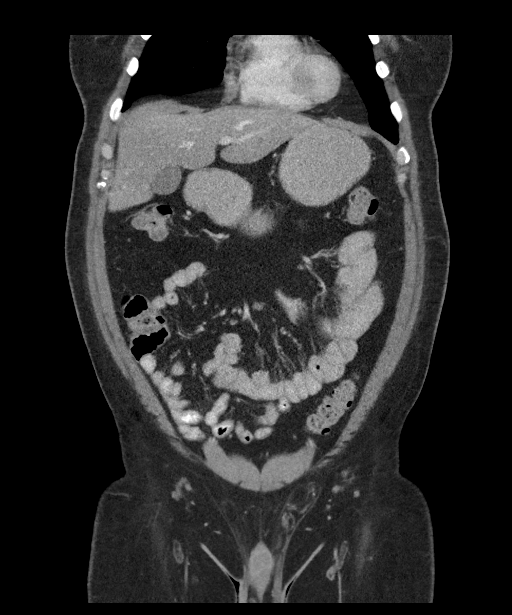
[im 44/99  soft-tissue]
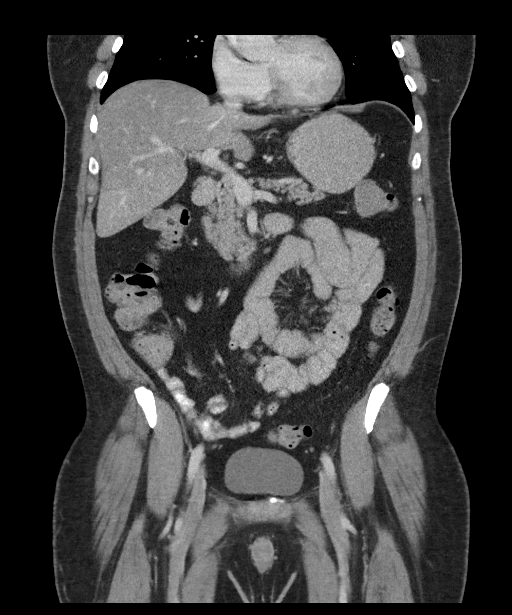
[im 55/99  soft-tissue]
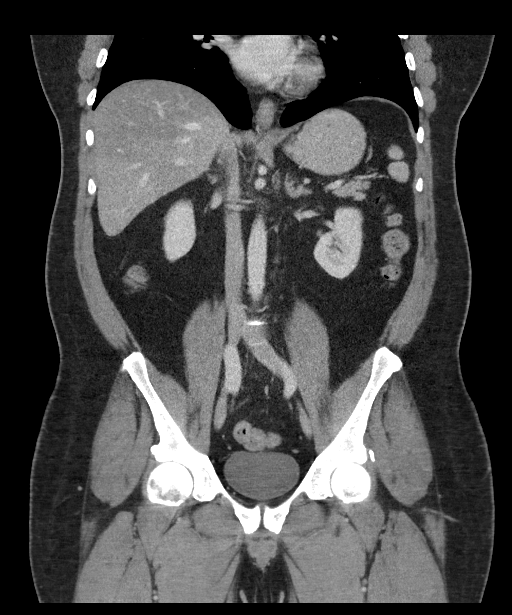

[16 of 46 positions shown; findings below may reference images not displayed]

FINDINGS: Lower chest: No acute abnormality.  Right gynecomastia.

Hepatobiliary: Hepatic steatosis. Normal appearance of the
gallbladder.

Pancreas: Unremarkable. No pancreatic ductal dilatation or
surrounding inflammatory changes.

Spleen: Normal in size without focal abnormality.

Adrenals/Urinary Tract: Adrenal glands are unremarkable. Kidneys are
normal, without renal calculi, focal lesion, or hydronephrosis.
Bladder is unremarkable.

Stomach/Bowel: Stomach is within normal limits. Appendix appears
normal. No evidence of bowel wall thickening, distention, or
inflammatory changes. Scattered colonic diverticulosis.

Vascular/Lymphatic: No significant vascular findings are present. No
enlarged abdominal or pelvic lymph nodes.

Reproductive: Prostate is unremarkable.

Other: No abdominal wall hernia or abnormality. No abdominopelvic
ascites.

Musculoskeletal: No acute or significant osseous findings.
IMPRESSION: Hepatic steatosis.

Scattered colonic diverticulosis.  No evidence of diverticulitis.

Otherwise, no CT evidence of acute abnormalities within the abdomen
or pelvis.

## 2019-10-23 ENCOUNTER — Encounter (HOSPITAL_BASED_OUTPATIENT_CLINIC_OR_DEPARTMENT_OTHER): Payer: Self-pay

## 2019-10-23 ENCOUNTER — Emergency Department (HOSPITAL_BASED_OUTPATIENT_CLINIC_OR_DEPARTMENT_OTHER): Payer: BC Managed Care – PPO

## 2019-10-23 ENCOUNTER — Emergency Department (HOSPITAL_BASED_OUTPATIENT_CLINIC_OR_DEPARTMENT_OTHER)
Admission: EM | Admit: 2019-10-23 | Discharge: 2019-10-23 | Disposition: A | Discharge status: Home / Self Care | Payer: BC Managed Care – PPO | Attending: Emergency Medicine | Admitting: Emergency Medicine

## 2019-10-23 ENCOUNTER — Other Ambulatory Visit: Payer: Self-pay

## 2019-10-23 DIAGNOSIS — Z7982 Long term (current) use of aspirin: Secondary | ICD-10-CM | POA: Diagnosis not present

## 2019-10-23 DIAGNOSIS — I10 Essential (primary) hypertension: Secondary | ICD-10-CM | POA: Insufficient documentation

## 2019-10-23 DIAGNOSIS — Z79899 Other long term (current) drug therapy: Secondary | ICD-10-CM | POA: Diagnosis not present

## 2019-10-23 DIAGNOSIS — R1031 Right lower quadrant pain: Secondary | ICD-10-CM | POA: Diagnosis not present

## 2019-10-23 DIAGNOSIS — R109 Unspecified abdominal pain: Secondary | ICD-10-CM

## 2019-10-23 DIAGNOSIS — Z7984 Long term (current) use of oral hypoglycemic drugs: Secondary | ICD-10-CM | POA: Insufficient documentation

## 2019-10-23 DIAGNOSIS — E119 Type 2 diabetes mellitus without complications: Secondary | ICD-10-CM | POA: Insufficient documentation

## 2019-10-23 DIAGNOSIS — R1030 Lower abdominal pain, unspecified: Secondary | ICD-10-CM | POA: Diagnosis not present

## 2019-10-23 HISTORY — DX: Type 2 diabetes mellitus without complications: E11.9

## 2019-10-23 LAB — URINALYSIS, ROUTINE W REFLEX MICROSCOPIC
Bilirubin Urine: NEGATIVE
Glucose, UA: NEGATIVE mg/dL
Hgb urine dipstick: NEGATIVE
Ketones, ur: NEGATIVE mg/dL
Leukocytes,Ua: NEGATIVE
Nitrite: NEGATIVE
Protein, ur: NEGATIVE mg/dL
Specific Gravity, Urine: >1.03 — ABNORMAL HIGH (ref 1.005–1.030)
pH: 6 (ref 5.0–8.0)

## 2019-10-23 LAB — COMPREHENSIVE METABOLIC PANEL
ALT: 39 U/L (ref 0–44)
AST: 34 U/L (ref 15–41)
Albumin: 4.4 g/dL (ref 3.5–5.0)
Alkaline Phosphatase: 87 U/L (ref 38–126)
Anion gap: 11 (ref 5–15)
BUN: 13 mg/dL (ref 6–20)
CO2: 23 mmol/L (ref 22–32)
Calcium: 8.8 mg/dL — ABNORMAL LOW (ref 8.9–10.3)
Chloride: 105 mmol/L (ref 98–111)
Creatinine, Ser: 1.24 mg/dL (ref 0.61–1.24)
GFR calc Af Amer: >60 mL/min (ref >60)
GFR calc non Af Amer: >60 mL/min (ref >60)
Glucose, Bld: 169 mg/dL — ABNORMAL HIGH (ref 70–99)
Potassium: 4.2 mmol/L (ref 3.5–5.1)
Sodium: 139 mmol/L (ref 135–145)
Total Bilirubin: 0.6 mg/dL (ref 0.3–1.2)
Total Protein: 7.1 g/dL (ref 6.5–8.1)

## 2019-10-23 LAB — CBC
HCT: 44.8 % (ref 39.0–52.0)
Hemoglobin: 14.2 g/dL (ref 13.0–17.0)
MCH: 27.3 pg (ref 26.0–34.0)
MCHC: 31.7 g/dL (ref 30.0–36.0)
MCV: 86.2 fL (ref 80.0–100.0)
Platelets: 207 10*3/uL (ref 150–400)
RBC: 5.2 MIL/uL (ref 4.22–5.81)
RDW: 13.5 % (ref 11.5–15.5)
WBC: 16.5 10*3/uL — ABNORMAL HIGH (ref 4.0–10.5)
nRBC: 0 % (ref 0.0–0.2)

## 2019-10-23 LAB — LIPASE, BLOOD: Lipase: 26 U/L (ref 11–51)

## 2019-10-23 MED ORDER — ONDANSETRON HCL 4 MG/2ML IJ SOLN
4.0000 mg | Freq: Once | INTRAMUSCULAR | Status: DC
  Filled 2019-10-23: qty 2

## 2019-10-23 MED ORDER — HYDROCODONE-ACETAMINOPHEN 5-325 MG PO TABS: 1.0000 | ORAL_TABLET | Freq: Four times a day (QID) | ORAL | 0 refills | Status: AC | PRN

## 2019-10-23 MED ORDER — FENTANYL CITRATE (PF) 100 MCG/2ML IJ SOLN
50.0000 ug | INTRAMUSCULAR | Status: DC | PRN
  Administered 2019-10-23: 50 ug via NASAL
  Filled 2019-10-23: qty 2

## 2019-10-23 MED ORDER — IOHEXOL 300 MG/ML  SOLN
100.0000 mL | Freq: Once | INTRAMUSCULAR | Status: AC | PRN
  Administered 2019-10-23: 100 mL via INTRAVENOUS

## 2019-10-23 MED ORDER — ONDANSETRON 4 MG PO TBDP
4.0000 mg | ORAL_TABLET | Freq: Once | ORAL | Status: AC
  Administered 2019-10-23: 4 mg via ORAL
  Filled 2019-10-23: qty 1

## 2019-10-23 MED ORDER — KETOROLAC TROMETHAMINE 15 MG/ML IJ SOLN
15.0000 mg | Freq: Once | INTRAMUSCULAR | Status: AC
  Administered 2019-10-23: 15 mg via INTRAVENOUS
  Filled 2019-10-23: qty 1

## 2019-10-23 MED ORDER — ONDANSETRON 4 MG PO TBDP: 4.0000 mg | ORAL_TABLET | Freq: Three times a day (TID) | ORAL | 0 refills | Status: AC | PRN

## 2019-10-23 NOTE — ED Notes (Signed)
Pt restless, unable to sit or stand with comfort

## 2019-10-23 NOTE — ED Provider Notes (Signed)
[facility] EMERGENCY DEPARTMENT Provider Note   CSN: [id number] Arrival date & time: 10/23/19  1536     History Chief Complaint  Patient presents with  . Abdominal Pain    Eric Byrd is a 59 y.o. male with past medical history significant for diabetes and hypertension.  Had Covid vaccinations.  No abdominal surgical history.  HPI Patient reports to emergency room today with chief complaint of lower abdominal pain that started approximately 4 hours prior to arrival.  Patient states the pain started after having a bowel movement.  He denies any diarrhea, bloody stool or black tarry stool.  He states the pain is located in his right lower quadrant and radiates across his lower abdomen.  Pain does not radiate to his groin.  The pain has been constant.  He endorses nausea with 2 episodes of nonbloody nonbilious emesis prior to arrival.  He did not take any medications for symptoms prior to arrival.  He denies any suspicious food intake or recent travel.  No family members have similar symptoms.  He denies fever, chills, chest pain, urinary frequency, dysuria, gross hematuria, back pain, diarrhea, rash.  Patient very rarely drinks alcohol, had a sip of wine x1 week ago.  Denies any drug use.  Past Medical History:  Diagnosis Date  . Diabetes mellitus without complication (HCC)   . Hypertension     Patient Active Problem List   Diagnosis Date Noted  . Newly diagnosed diabetes (HCC) 03/18/2017    Past Surgical History:  Procedure Laterality Date  . ACHILLES TENDON REPAIR    . CERVICAL SPINE SURGERY    . CYST EXCISION         No family history on file.  Social History   Tobacco Use  . Smoking status: Never Smoker  . Smokeless tobacco: Never Used  Vaping Use  . Vaping Use: Never used  Substance Use Topics  . Alcohol use: Yes    Comment: very rarley   . Drug use: No    Home Medications Prior to Admission medications   Medication Sig Start Date End  Date Taking? Authorizing Provider  aspirin EC 81 MG tablet Take 81 mg by mouth daily.    [provider]  enalapril (VASOTEC) 5 MG tablet Take 5 mg by mouth daily.    [provider]  HYDROcodone-acetaminophen (NORCO/VICODIN) 5-325 MG tablet Take 1 tablet by mouth every 6 (six) hours as needed for severe pain. 10/23/19   Shaquaya Wuellner E, PA-C  metFORMIN (GLUCOPHAGE) 500 MG tablet Take 1 tablet (500 mg total) by mouth 2 (two) times daily with a meal. 01/08/17   Jefm Bryant, Jory Sims, PA-C  ondansetron (ZOFRAN ODT) 4 MG disintegrating tablet Take 1 tablet (4 mg total) by mouth every 8 (eight) hours as needed for nausea or vomiting. 10/23/19   Analyah Mcconnon, Yvonna Alanis E, PA-C  simvastatin (ZOCOR) 20 MG tablet Take 20 mg by mouth daily.    [provider]    Allergies    Patient has no known allergies.  Review of Systems   Review of Systems  All other systems are reviewed and are negative for acute change except as noted in the HPI.   Physical Exam Updated Vital Signs BP (!) 138/114 (BP Location: Left Arm)   Pulse 61   Temp 98.6 F (37 C) (Oral)   Resp 20   Ht 5\' 7"  (1.702 m)   Wt 108.9 kg   SpO2 98%   BMI 37.59 kg/m  Physical Exam Vitals and nursing note reviewed.  Constitutional:      General: He is not in acute distress.    Appearance: He is not ill-appearing.  HENT:     Head: Normocephalic and atraumatic.     Right Ear: Tympanic membrane and external ear normal.     Left Ear: Tympanic membrane and external ear normal.     Nose: Nose normal.     Mouth/Throat:     Mouth: Mucous membranes are moist.     Pharynx: Oropharynx is clear.  Eyes:     General: No scleral icterus.       Right eye: No discharge.        Left eye: No discharge.     Extraocular Movements: Extraocular movements intact.     Conjunctiva/sclera: Conjunctivae normal.     Pupils: Pupils are equal, round, and reactive to light.  Neck:     Vascular: No JVD.  Cardiovascular:     Rate  and Rhythm: Normal rate and regular rhythm.     Pulses: Normal pulses.          Radial pulses are 2+ on the right side and 2+ on the left side.     Heart sounds: Normal heart sounds.  Pulmonary:     Comments: Lungs clear to auscultation in all fields. Symmetric chest rise. No wheezing, rales, or rhonchi. Abdominal:     Tenderness: There is abdominal tenderness in the right lower quadrant, suprapubic area and left lower quadrant. There is no right CVA tenderness or left CVA tenderness. Negative signs include Murphy's sign, Rovsing's sign, McBurney's sign, psoas sign and obturator sign.     Comments: Abdomen is soft, non-distended.  No rigidity, no guarding. No peritoneal signs.  Musculoskeletal:        General: Normal range of motion.     Cervical back: Normal range of motion.  Skin:    General: Skin is warm and dry.     Capillary Refill: Capillary refill takes less than 2 seconds.  Neurological:     Mental Status: He is oriented to person, place, and time.     GCS: GCS eye subscore is 4. GCS verbal subscore is 5. GCS motor subscore is 6.     Comments: Fluent speech, no facial droop.  Psychiatric:        Behavior: Behavior normal.     ED Results / Procedures / Treatments   Labs (all labs ordered are listed, but only abnormal results are displayed) Labs Reviewed  URINALYSIS, ROUTINE W REFLEX MICROSCOPIC - Abnormal; Notable for the following components:      Result Value   Specific Gravity, Urine >1.030 (*)    All other components within normal limits  COMPREHENSIVE METABOLIC PANEL - Abnormal; Notable for the following components:   Glucose, Bld 169 (*)    Calcium 8.8 (*)    All other components within normal limits  CBC - Abnormal; Notable for the following components:   WBC 16.5 (*)    All other components within normal limits  LIPASE, BLOOD    EKG None  Radiology CT ABDOMEN PELVIS W CONTRAST  Result Date: 10/23/2019 CLINICAL DATA:  Abdominal pain. EXAM: CT ABDOMEN AND  PELVIS WITH CONTRAST TECHNIQUE: Multidetector CT imaging of the abdomen and pelvis was performed using the standard protocol following bolus administration of intravenous contrast. CONTRAST:  OMNIPAQUE IOHEXOL 300 MG/ML  SOLN COMPARISON:  January 08, 2017 FINDINGS: Lower chest: The lung bases are clear. The heart size  is normal. Hepatobiliary: The liver is normal. Normal gallbladder.There is no biliary ductal dilation. Pancreas: Normal contours without ductal dilatation. No peripancreatic fluid collection. Spleen: Unremarkable. Adrenals/Urinary Tract: --Adrenal glands: Unremarkable. --Right kidney/ureter: No hydronephrosis or radiopaque kidney stones. --Left kidney/ureter: There is mild fat stranding about the left ureter with apparent asymmetric left-sided urothelial enhancement. --Urinary bladder: Unremarkable. Stomach/Bowel: --Stomach/Duodenum: No hiatal hernia or other gastric abnormality. Normal duodenal course and caliber. --Small bowel: Unremarkable. --Colon: Unremarkable. --Appendix: Normal. Vascular/Lymphatic: Normal course and caliber of the major abdominal vessels. --No retroperitoneal lymphadenopathy. --No mesenteric lymphadenopathy. --No pelvic or inguinal lymphadenopathy. Reproductive: Unremarkable Other: No ascites or free air. The abdominal wall is normal. Musculoskeletal. No acute displaced fractures. IMPRESSION: Subtle fat stranding about the left ureter without evidence for hydronephrosis. Findings may be secondary to a recently passed stone versus an ascending urinary tract infection. Electronically Signed   By: Katherine Mantle M.D.   On: 10/23/2019 17:58    Procedures Procedures (including critical care time)  Medications Ordered in ED Medications  fentaNYL (SUBLIMAZE) injection 50 mcg (50 mcg Nasal Given 10/23/19 1624)  ondansetron (ZOFRAN-ODT) disintegrating tablet 4 mg (4 mg Oral Given 10/23/19 1629)  iohexol (OMNIPAQUE) 300 MG/ML solution 100 mL (100 mLs Intravenous  Contrast Given 10/23/19 1743)  ketorolac (TORADOL) 15 MG/ML injection 15 mg (15 mg Intravenous Given 10/23/19 1909)    ED Course  I have reviewed the triage vital signs and the nursing notes.  Pertinent labs & imaging results that were available during my care of the patient were reviewed by me and considered in my medical decision making (see chart for details).    MDM Rules/Calculators/A&P                          History provided by patient with additional history obtained from chart review.    Patient presents to the ED with complaints of abdominal pain. Patient afebrile, nontoxic appearing, in no apparent distress. On exam patient tender to palpation of RLQ and LLQ, no peritoneal signs. He defers GU exam. He was given dose of fentanyl and zofran in triage as he appeared very uncomfortable. Bladder scan also performed post void and showed 0 ml.   Labs were collected in triage. I viewed results which show non-specific leukocytosis of 16.5, no anemia. No significant electrolyte derangement, no renal insufficiency, no transaminitis. Lipase within normal range. UA without infection, without blood. Imaging shows subtle fat stranding around left ureter without hydronephrosis. Suggesting possible stone recently passed. With patient's sudden onset of pain this is likely cause of symptoms. On repeat abdominal exam patient remains without peritoneal signs, doubt cholecystitis, pancreatitis, diverticulitis, appendicitis, bowel obstruction/perforation.  Patient tolerating PO in the emergency department. Blood pressure improved after pain controlled. Will give IM toradol.  Will discharge home with supportive measures including short course for norco for severe pain. I have reviewed the PDMP during this encounter. He has no recent narcotic prescriptions. I discussed results, treatment plan, need for PCP follow-up, and return precautions with the patient. Provided opportunity for questions, patient confirmed  understanding and is in agreement with plan. Findings and plan of care discussed with supervising physician Dr. Silverio Lay.   Portions of this note were generated with Scientist, clinical (histocompatibility and immunogenetics). Dictation errors may occur despite best attempts at proofreading.   Final Clinical Impression(s) / ED Diagnoses Final diagnoses:  Abdominal pain, unspecified abdominal location    Rx / DC Orders ED Discharge Orders  Ordered    HYDROcodone-acetaminophen (NORCO/VICODIN) 5-325 MG tablet  Every 6 hours PRN        10/23/19 1902    ondansetron (ZOFRAN ODT) 4 MG disintegrating tablet  Every 8 hours PRN        10/23/19 1902           Kathyrn Lasslbrizze, Liddie Chichester E, PA-C 10/23/19 1915    Charlynne PanderYao, David Hsienta, MD 10/23/19 2320

## 2019-10-23 NOTE — ED Notes (Signed)
Post-void bladder scan resulted at 0 mL

## 2019-10-23 NOTE — ED Triage Notes (Signed)
Pt c/o lower abd pain that started this AM after having a normal BM. Pt states he has the urge to urinate, but then is unable to get much urine out. Pt has had one episode of emesis.

## 2019-10-23 NOTE — Discharge Instructions (Addendum)
Your work-up today shows you likely recently passed a kidney stone.  A prescription for nausea medicine was sent to your pharmacy.  Take if needed.  Prescription for Norco was also sent to the pharmacy.  This is a narcotic pain medicine.  It is for severe pain only.  Do not take if you are going to be working or driving as it can make you drowsy.  Do not take Tylenol at the same time as it has Tylenol in it already.  -Recommend you take anti-inflammatory medications at home to help with pain as well.  You can take ibuprofen as recommended on the bottle.

## 2020-07-18 DIAGNOSIS — E1165 Type 2 diabetes mellitus with hyperglycemia: Secondary | ICD-10-CM | POA: Diagnosis not present

## 2020-07-18 DIAGNOSIS — E785 Hyperlipidemia, unspecified: Secondary | ICD-10-CM | POA: Diagnosis not present

## 2020-07-18 DIAGNOSIS — I10 Essential (primary) hypertension: Secondary | ICD-10-CM | POA: Diagnosis not present

## 2020-08-22 DIAGNOSIS — E1165 Type 2 diabetes mellitus with hyperglycemia: Secondary | ICD-10-CM | POA: Diagnosis not present

## 2020-09-02 DIAGNOSIS — E1169 Type 2 diabetes mellitus with other specified complication: Secondary | ICD-10-CM | POA: Diagnosis not present

## 2020-09-02 DIAGNOSIS — Z125 Encounter for screening for malignant neoplasm of prostate: Secondary | ICD-10-CM | POA: Diagnosis not present

## 2020-09-02 DIAGNOSIS — I10 Essential (primary) hypertension: Secondary | ICD-10-CM | POA: Diagnosis not present

## 2020-09-11 DIAGNOSIS — E1169 Type 2 diabetes mellitus with other specified complication: Secondary | ICD-10-CM | POA: Diagnosis not present

## 2020-09-11 DIAGNOSIS — Z125 Encounter for screening for malignant neoplasm of prostate: Secondary | ICD-10-CM | POA: Diagnosis not present

## 2020-11-01 DIAGNOSIS — M25571 Pain in right ankle and joints of right foot: Secondary | ICD-10-CM | POA: Diagnosis not present

## 2020-11-09 DIAGNOSIS — S86011A Strain of right Achilles tendon, initial encounter: Secondary | ICD-10-CM | POA: Diagnosis not present

## 2020-11-09 DIAGNOSIS — Y999 Unspecified external cause status: Secondary | ICD-10-CM | POA: Diagnosis not present

## 2020-11-09 DIAGNOSIS — X58XXXA Exposure to other specified factors, initial encounter: Secondary | ICD-10-CM | POA: Diagnosis not present

## 2020-11-09 DIAGNOSIS — G8918 Other acute postprocedural pain: Secondary | ICD-10-CM | POA: Diagnosis not present

## 2020-12-16 DIAGNOSIS — E1169 Type 2 diabetes mellitus with other specified complication: Secondary | ICD-10-CM | POA: Diagnosis not present

## 2020-12-16 DIAGNOSIS — I10 Essential (primary) hypertension: Secondary | ICD-10-CM | POA: Diagnosis not present

## 2020-12-16 DIAGNOSIS — E785 Hyperlipidemia, unspecified: Secondary | ICD-10-CM | POA: Diagnosis not present

## 2020-12-28 DIAGNOSIS — M25671 Stiffness of right ankle, not elsewhere classified: Secondary | ICD-10-CM | POA: Diagnosis not present

## 2020-12-28 DIAGNOSIS — S86011D Strain of right Achilles tendon, subsequent encounter: Secondary | ICD-10-CM | POA: Diagnosis not present

## 2020-12-28 DIAGNOSIS — M6281 Muscle weakness (generalized): Secondary | ICD-10-CM | POA: Diagnosis not present

## 2020-12-28 DIAGNOSIS — R262 Difficulty in walking, not elsewhere classified: Secondary | ICD-10-CM | POA: Diagnosis not present

## 2021-01-08 DIAGNOSIS — M6281 Muscle weakness (generalized): Secondary | ICD-10-CM | POA: Diagnosis not present

## 2021-01-08 DIAGNOSIS — S86011D Strain of right Achilles tendon, subsequent encounter: Secondary | ICD-10-CM | POA: Diagnosis not present

## 2021-01-08 DIAGNOSIS — M25671 Stiffness of right ankle, not elsewhere classified: Secondary | ICD-10-CM | POA: Diagnosis not present

## 2021-01-08 DIAGNOSIS — R262 Difficulty in walking, not elsewhere classified: Secondary | ICD-10-CM | POA: Diagnosis not present

## 2021-01-16 DIAGNOSIS — S86011D Strain of right Achilles tendon, subsequent encounter: Secondary | ICD-10-CM | POA: Diagnosis not present

## 2021-01-16 DIAGNOSIS — M25671 Stiffness of right ankle, not elsewhere classified: Secondary | ICD-10-CM | POA: Diagnosis not present

## 2021-01-16 DIAGNOSIS — M6281 Muscle weakness (generalized): Secondary | ICD-10-CM | POA: Diagnosis not present

## 2021-01-16 DIAGNOSIS — R262 Difficulty in walking, not elsewhere classified: Secondary | ICD-10-CM | POA: Diagnosis not present

## 2021-01-23 DIAGNOSIS — S86011D Strain of right Achilles tendon, subsequent encounter: Secondary | ICD-10-CM | POA: Diagnosis not present

## 2021-01-23 DIAGNOSIS — R262 Difficulty in walking, not elsewhere classified: Secondary | ICD-10-CM | POA: Diagnosis not present

## 2021-01-23 DIAGNOSIS — M6281 Muscle weakness (generalized): Secondary | ICD-10-CM | POA: Diagnosis not present

## 2021-01-23 DIAGNOSIS — M25671 Stiffness of right ankle, not elsewhere classified: Secondary | ICD-10-CM | POA: Diagnosis not present
